# Patient Record
Sex: Female | Born: 1970 | Race: White | Hispanic: No | Marital: Married | Smoking: Never smoker
Health system: Southern US, Community
[De-identification: ages and names within clinical notes are randomized; demographics above are authoritative.]

## PROBLEM LIST (undated history)

## (undated) DIAGNOSIS — R011 Cardiac murmur, unspecified: Secondary | ICD-10-CM

## (undated) DIAGNOSIS — R51 Headache: Secondary | ICD-10-CM

## (undated) DIAGNOSIS — R519 Headache, unspecified: Secondary | ICD-10-CM

## (undated) DIAGNOSIS — B029 Zoster without complications: Secondary | ICD-10-CM

## (undated) DIAGNOSIS — E079 Disorder of thyroid, unspecified: Secondary | ICD-10-CM

## (undated) DIAGNOSIS — J45909 Unspecified asthma, uncomplicated: Secondary | ICD-10-CM

## (undated) HISTORY — DX: Cardiac murmur, unspecified: R01.1

## (undated) HISTORY — DX: Headache, unspecified: R51.9

## (undated) HISTORY — DX: Headache: R51

## (undated) HISTORY — DX: Unspecified asthma, uncomplicated: J45.909

## (undated) HISTORY — DX: Disorder of thyroid, unspecified: E07.9

## (undated) HISTORY — DX: Zoster without complications: B02.9

---

## 1992-09-10 HISTORY — PX: OVARIAN CYST REMOVAL: SHX89

## 1994-11-10 DIAGNOSIS — E079 Disorder of thyroid, unspecified: Secondary | ICD-10-CM

## 1994-11-10 HISTORY — DX: Disorder of thyroid, unspecified: E07.9

## 1998-04-18 ENCOUNTER — Ambulatory Visit (HOSPITAL_COMMUNITY): Admission: RE | Admit: 1998-04-18 | Discharge: 1998-04-18 | Payer: Self-pay | Admitting: Obstetrics and Gynecology

## 1998-05-07 ENCOUNTER — Inpatient Hospital Stay (HOSPITAL_COMMUNITY): Admission: AD | Admit: 1998-05-07 | Discharge: 1998-05-07 | Payer: Self-pay | Admitting: *Deleted

## 1998-06-15 ENCOUNTER — Encounter (HOSPITAL_COMMUNITY): Admission: RE | Admit: 1998-06-15 | Discharge: 1998-06-29 | Payer: Self-pay | Admitting: Obstetrics & Gynecology

## 1998-06-25 ENCOUNTER — Inpatient Hospital Stay (HOSPITAL_COMMUNITY): Admission: AD | Admit: 1998-06-25 | Discharge: 1998-06-25 | Payer: Self-pay | Admitting: Obstetrics and Gynecology

## 1998-06-27 ENCOUNTER — Inpatient Hospital Stay (HOSPITAL_COMMUNITY): Admission: AD | Admit: 1998-06-27 | Discharge: 1998-07-01 | Payer: Self-pay | Admitting: Obstetrics & Gynecology

## 1998-06-30 ENCOUNTER — Encounter (HOSPITAL_COMMUNITY): Admission: RE | Admit: 1998-06-30 | Discharge: 1998-09-28 | Payer: Self-pay | Admitting: Obstetrics & Gynecology

## 1998-08-06 ENCOUNTER — Other Ambulatory Visit: Admission: RE | Admit: 1998-08-06 | Discharge: 1998-08-06 | Payer: Self-pay | Admitting: Obstetrics and Gynecology

## 1998-10-08 ENCOUNTER — Encounter (HOSPITAL_COMMUNITY): Admission: RE | Admit: 1998-10-08 | Discharge: 1999-01-06 | Payer: Self-pay | Admitting: *Deleted

## 1999-01-31 ENCOUNTER — Encounter (HOSPITAL_COMMUNITY): Admission: RE | Admit: 1999-01-31 | Discharge: 1999-05-01 | Payer: Self-pay | Admitting: *Deleted

## 1999-05-15 ENCOUNTER — Emergency Department (HOSPITAL_COMMUNITY): Admission: EM | Admit: 1999-05-15 | Discharge: 1999-05-16 | Payer: Self-pay | Admitting: Emergency Medicine

## 2001-03-16 ENCOUNTER — Other Ambulatory Visit: Admission: RE | Admit: 2001-03-16 | Discharge: 2001-03-16 | Payer: Self-pay | Admitting: Obstetrics and Gynecology

## 2001-10-04 ENCOUNTER — Encounter: Admission: RE | Admit: 2001-10-04 | Discharge: 2001-11-24 | Payer: Self-pay | Admitting: Orthopedic Surgery

## 2002-02-18 ENCOUNTER — Encounter: Admission: RE | Admit: 2002-02-18 | Discharge: 2002-02-18 | Payer: Self-pay | Admitting: Obstetrics and Gynecology

## 2002-02-18 ENCOUNTER — Encounter: Payer: Self-pay | Admitting: Obstetrics and Gynecology

## 2002-11-15 ENCOUNTER — Emergency Department (HOSPITAL_COMMUNITY): Admission: EM | Admit: 2002-11-15 | Discharge: 2002-11-15 | Payer: Self-pay

## 2003-01-02 ENCOUNTER — Other Ambulatory Visit: Admission: RE | Admit: 2003-01-02 | Discharge: 2003-01-02 | Payer: Self-pay | Admitting: Obstetrics and Gynecology

## 2004-01-17 ENCOUNTER — Other Ambulatory Visit: Admission: RE | Admit: 2004-01-17 | Discharge: 2004-01-17 | Payer: Self-pay | Admitting: Obstetrics and Gynecology

## 2005-09-17 ENCOUNTER — Other Ambulatory Visit: Admission: RE | Admit: 2005-09-17 | Discharge: 2005-09-17 | Payer: Self-pay | Admitting: Obstetrics and Gynecology

## 2005-09-18 ENCOUNTER — Ambulatory Visit (HOSPITAL_COMMUNITY): Admission: RE | Admit: 2005-09-18 | Discharge: 2005-09-18 | Payer: Self-pay | Admitting: Allergy

## 2005-10-07 ENCOUNTER — Encounter: Admission: RE | Admit: 2005-10-07 | Discharge: 2005-10-07 | Payer: Self-pay | Admitting: Obstetrics and Gynecology

## 2006-02-02 ENCOUNTER — Emergency Department (HOSPITAL_COMMUNITY): Admission: EM | Admit: 2006-02-02 | Discharge: 2006-02-02 | Payer: Self-pay | Admitting: Emergency Medicine

## 2006-03-11 ENCOUNTER — Encounter: Payer: Self-pay | Admitting: Diagnostic Radiology

## 2010-01-25 ENCOUNTER — Encounter: Admission: RE | Admit: 2010-01-25 | Discharge: 2010-01-25 | Payer: Self-pay | Admitting: Obstetrics and Gynecology

## 2010-05-14 ENCOUNTER — Ambulatory Visit: Payer: Self-pay | Admitting: Pulmonary Disease

## 2010-10-17 ENCOUNTER — Inpatient Hospital Stay (HOSPITAL_COMMUNITY): Admission: EM | Admit: 2010-10-17 | Discharge: 2010-05-16 | Payer: Self-pay | Admitting: Emergency Medicine

## 2010-11-10 DIAGNOSIS — B029 Zoster without complications: Secondary | ICD-10-CM

## 2010-11-10 HISTORY — DX: Zoster without complications: B02.9

## 2011-01-26 LAB — BASIC METABOLIC PANEL WITH GFR
BUN: 4 mg/dL — ABNORMAL LOW (ref 6–23)
CO2: 28 meq/L (ref 19–32)
Calcium: 8.4 mg/dL (ref 8.4–10.5)
Chloride: 103 meq/L (ref 96–112)
Creatinine, Ser: 0.75 mg/dL (ref 0.4–1.2)
GFR calc non Af Amer: >60 mL/min
Glucose, Bld: 94 mg/dL (ref 70–99)
Potassium: 3.8 meq/L (ref 3.5–5.1)
Sodium: 136 meq/L (ref 135–145)

## 2011-01-26 LAB — BASIC METABOLIC PANEL
BUN: 6 mg/dL (ref 6–23)
CO2: 26 mEq/L (ref 19–32)
Chloride: 108 mEq/L (ref 96–112)
Creatinine, Ser: 0.67 mg/dL (ref 0.4–1.2)
GFR calc Af Amer: >60 mL/min (ref >60)
GFR calc non Af Amer: >60 mL/min (ref >60)
Glucose, Bld: 95 mg/dL (ref 70–99)
Potassium: 3.7 mEq/L (ref 3.5–5.1)
Sodium: 138 mEq/L (ref 135–145)

## 2011-01-26 LAB — CBC
HCT: 38.5 % (ref 36.0–46.0)
HCT: 45.3 % (ref 36.0–46.0)
Hemoglobin: 13.4 g/dL (ref 12.0–15.0)
Hemoglobin: 13.4 g/dL (ref 12.0–15.0)
Hemoglobin: 15.6 g/dL — ABNORMAL HIGH (ref 12.0–15.0)
MCH: 31.8 pg (ref 26.0–34.0)
MCH: 32.1 pg (ref 26.0–34.0)
MCH: 32.2 pg (ref 26.0–34.0)
MCH: 32.4 pg (ref 26.0–34.0)
MCHC: 34.4 g/dL (ref 30.0–36.0)
MCHC: 34.5 g/dL (ref 30.0–36.0)
MCHC: 34.6 g/dL (ref 30.0–36.0)
MCHC: 34.7 g/dL (ref 30.0–36.0)
MCV: 93.3 fL (ref 78.0–100.0)
MCV: 93.5 fL (ref 78.0–100.0)
Platelets: 223 10*3/uL (ref 150–400)
Platelets: 236 10*3/uL (ref 150–400)
Platelets: 332 K/uL (ref 150–400)
RBC: 3.87 MIL/uL (ref 3.87–5.11)
RBC: 4.12 MIL/uL (ref 3.87–5.11)
RBC: 4.86 MIL/uL (ref 3.87–5.11)
RDW: 12.8 % (ref 11.5–15.5)
RDW: 12.9 % (ref 11.5–15.5)
RDW: 13.2 % (ref 11.5–15.5)
WBC: 7.6 10*3/uL (ref 4.0–10.5)
WBC: 8.2 10*3/uL (ref 4.0–10.5)
WBC: 9.9 K/uL (ref 4.0–10.5)

## 2011-01-26 LAB — DIFFERENTIAL
Basophils Absolute: 0.2 10*3/uL — ABNORMAL HIGH (ref 0.0–0.1)
Basophils Relative: 2 % — ABNORMAL HIGH (ref 0–1)
Eosinophils Absolute: 0.3 10*3/uL (ref 0.0–0.7)
Eosinophils Absolute: 0.4 10*3/uL (ref 0.0–0.7)
Eosinophils Relative: 4 % (ref 0–5)
Lymphocytes Relative: 26 % (ref 12–46)
Lymphs Abs: 2.6 10*3/uL (ref 0.7–4.0)
Monocytes Absolute: 0.9 10*3/uL (ref 0.1–1.0)
Monocytes Relative: 8 % (ref 3–12)
Monocytes Relative: 9 % (ref 3–12)
Neutro Abs: 5.9 10*3/uL (ref 1.7–7.7)
Neutro Abs: 5.9 10*3/uL (ref 1.7–7.7)
Neutrophils Relative %: 59 % (ref 43–77)

## 2011-01-26 LAB — MAGNESIUM: Magnesium: 1.7 mg/dL (ref 1.5–2.5)

## 2011-01-26 LAB — COMPREHENSIVE METABOLIC PANEL WITH GFR
ALT: 16 U/L (ref 0–35)
AST: 24 U/L (ref 0–37)
Albumin: 4.5 g/dL (ref 3.5–5.2)
Alkaline Phosphatase: 32 U/L — ABNORMAL LOW (ref 39–117)
BUN: 6 mg/dL (ref 6–23)
CO2: 27 meq/L (ref 19–32)
Calcium: 9.3 mg/dL (ref 8.4–10.5)
Chloride: 105 meq/L (ref 96–112)
Creatinine, Ser: 0.96 mg/dL (ref 0.4–1.2)
GFR calc non Af Amer: >60 mL/min
Glucose, Bld: 77 mg/dL (ref 70–99)
Potassium: 3.7 meq/L (ref 3.5–5.1)
Sodium: 141 meq/L (ref 135–145)
Total Bilirubin: 0.5 mg/dL (ref 0.3–1.2)
Total Protein: 7.8 g/dL (ref 6.0–8.3)

## 2011-01-26 LAB — COMPREHENSIVE METABOLIC PANEL
CO2: 26 mEq/L (ref 19–32)
Calcium: 7.4 mg/dL — ABNORMAL LOW (ref 8.4–10.5)
Creatinine, Ser: 0.77 mg/dL (ref 0.4–1.2)
Potassium: 3.7 mEq/L (ref 3.5–5.1)
Sodium: 139 mEq/L (ref 135–145)
Total Bilirubin: 0.2 mg/dL — ABNORMAL LOW (ref 0.3–1.2)
Total Protein: 5.2 g/dL — ABNORMAL LOW (ref 6.0–8.3)

## 2011-01-26 LAB — CK TOTAL AND CKMB (NOT AT ARMC)
CK, MB: 0.5 ng/mL (ref 0.3–4.0)
CK, MB: 0.8 ng/mL (ref 0.3–4.0)
Relative Index: INVALID (ref 0.0–2.5)
Total CK: 63 U/L (ref 7–177)

## 2011-01-26 LAB — MRSA PCR SCREENING: MRSA by PCR: NEGATIVE

## 2011-01-26 LAB — PROTIME-INR
INR: 0.99 (ref 0.00–1.49)
Prothrombin Time: 13 s (ref 11.6–15.2)

## 2011-01-26 LAB — FIBRINOGEN: Fibrinogen: 228 mg/dL (ref 204–475)

## 2011-01-26 LAB — APTT: aPTT: 29 s (ref 24–37)

## 2011-02-10 ENCOUNTER — Other Ambulatory Visit: Payer: Self-pay | Admitting: Obstetrics and Gynecology

## 2012-11-26 ENCOUNTER — Other Ambulatory Visit: Payer: Self-pay | Admitting: Obstetrics and Gynecology

## 2012-11-26 DIAGNOSIS — R928 Other abnormal and inconclusive findings on diagnostic imaging of breast: Secondary | ICD-10-CM

## 2012-12-09 ENCOUNTER — Ambulatory Visit
Admission: RE | Admit: 2012-12-09 | Discharge: 2012-12-09 | Disposition: A | Discharge status: Home / Self Care | Payer: Self-pay | Source: Ambulatory Visit | Attending: Obstetrics and Gynecology | Admitting: Obstetrics and Gynecology

## 2012-12-09 ENCOUNTER — Other Ambulatory Visit: Payer: Self-pay

## 2012-12-09 DIAGNOSIS — R928 Other abnormal and inconclusive findings on diagnostic imaging of breast: Secondary | ICD-10-CM

## 2015-06-25 LAB — BASIC METABOLIC PANEL
BUN: 14 mg/dL (ref 4–21)
CREATININE: 0.8 mg/dL (ref 0.5–1.1)
GLUCOSE: 95 mg/dL
POTASSIUM: 4.9 mmol/L (ref 3.4–5.3)
Sodium: 138 mmol/L (ref 137–147)

## 2015-06-25 LAB — TSH: TSH: 0.02 u[IU]/mL — AB (ref 0.41–5.90)

## 2016-09-18 HISTORY — PX: DIAGNOSTIC MAMMOGRAM: HXRAD719

## 2016-09-23 ENCOUNTER — Ambulatory Visit: Payer: Self-pay | Admitting: Nurse Practitioner

## 2016-10-15 ENCOUNTER — Other Ambulatory Visit: Payer: Self-pay | Admitting: Obstetrics and Gynecology

## 2016-10-15 DIAGNOSIS — N644 Mastodynia: Secondary | ICD-10-CM

## 2016-10-21 ENCOUNTER — Ambulatory Visit: Payer: Self-pay | Admitting: Nurse Practitioner

## 2016-10-23 ENCOUNTER — Ambulatory Visit
Admission: RE | Admit: 2016-10-23 | Discharge: 2016-10-23 | Disposition: A | Discharge status: Home / Self Care | Payer: BLUE CROSS/BLUE SHIELD | Source: Ambulatory Visit | Attending: Obstetrics and Gynecology | Admitting: Obstetrics and Gynecology

## 2016-10-23 DIAGNOSIS — N644 Mastodynia: Secondary | ICD-10-CM

## 2016-10-24 ENCOUNTER — Ambulatory Visit (INDEPENDENT_AMBULATORY_CARE_PROVIDER_SITE_OTHER): Payer: BLUE CROSS/BLUE SHIELD | Admitting: Nurse Practitioner

## 2016-10-24 ENCOUNTER — Encounter: Payer: Self-pay | Admitting: Nurse Practitioner

## 2016-10-24 VITALS — BP 98/82 | HR 81 | Temp 98.7°F | Ht 65.0 in | Wt 148.0 lb

## 2016-10-24 DIAGNOSIS — E039 Hypothyroidism, unspecified: Secondary | ICD-10-CM | POA: Diagnosis not present

## 2016-10-24 DIAGNOSIS — Z0000 Encounter for general adult medical examination without abnormal findings: Secondary | ICD-10-CM | POA: Diagnosis not present

## 2016-10-24 DIAGNOSIS — B0089 Other herpesviral infection: Secondary | ICD-10-CM | POA: Diagnosis not present

## 2016-10-24 NOTE — Progress Notes (Signed)
Subjective:    Patient ID: Carrie Johnston, female    DOB: 1971/09/23, 45 y.o.   MRN: [id number]  Patient presents today for complete physical or establish care (new patient).  Rash  This is a recurrent problem. The current episode started more than 1 month ago. The problem has been waxing and waning since onset. The affected locations include the right buttock. The rash is characterized by pain, itchiness, redness, burning and blistering. She was exposed to nothing. Pertinent negatives include no anorexia, congestion, cough, diarrhea, facial edema, fever, joint pain, shortness of breath or sore throat. Treatments tried: valtrex. The treatment provided significant relief. Her past medical history is significant for varicella.  hx of more that 5 outbreaks in last 3months. Diagnosed with herpes zoster by urgent care provider.   Hypothyroidism: Thyroid disease managed by endocrinologist: Dr. Adrian PrinceStephen South. Last OV 2weeks ago.  Immunizations: (TDAP, Hep C screen, Pneumovax, Influenza, zoster)  Health Maintenance  Topic Date Due  . Flu Shot  02/07/2017*  . HIV Screening  10/12/2017*  . Pap Smear  10/15/2019  . Tetanus Vaccine  05/14/2020  *Topic was postponed. The date shown is not the original due date.   Diet:regular Weight:  Wt Readings from Last 3 Encounters:  10/24/16 148 lb (67.1 kg)   Exercise:walking and goes to gym Fall Risk: Fall Risk  10/24/2016  Falls in the past year? No   Home Safety:home with husband and children Depression/Suicide: [facility] 10/24/2016  Decreased Interest 0  Down, Depressed, Hopeless 0  PHQ - 2 Score 0   No flowsheet data found.  Pap Smear (every 7792yrs for >21-29 without HPV, every 35105yrs for >30-546105yrs with HPV):up to date, done by Dr. Lyda Jesterurtis with [facility], last done 10/14/2016 (normal per patient), records requested. Mammogram (yearly, >104105yrs):done 09/16/2016, normal per patient, records requested. Vision:needed,  patient will schedule Dental:up to date, cleaning every 6months  Sexual History (birth control, marital status, STD):sexually active, married  Medications and allergies reviewed with patient and updated if appropriate.  Patient Active Problem List   Diagnosis Date Noted  . Hypothyroidism 10/24/2016  . 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   No current facility-administered medications on file prior to visit.     Past Medical History:  Diagnosis Date  . Asthma   . Headache    frequent headache  . Heart murmur   . Shingles 2012   lower spin  . Thyroid disease 1996    Past Surgical History:  Procedure Laterality Date  . OVARIAN CYST REMOVAL  09/1992   cyst removal from ovary    Social History   Social History  . Marital status: Married    Spouse name: N/A  . Number of children: N/A  . Years of education: N/A   Social History Main Topics  . Smoking status: Never Smoker  . Smokeless tobacco: Never Used  . Alcohol use Yes     Comment: social  . Drug use: No  . Sexual activity: Not Asked   Other Topics Concern  . None   Social History Narrative  . None    Family History  Problem Relation Age of Onset  . Heart disease Maternal Grandmother   . Arthritis Maternal Grandfather   . Heart disease Maternal Grandfather   . Diabetes Maternal Grandfather   . Arthritis Paternal Grandmother     RA  . Heart disease Paternal Grandmother   . Diabetes Paternal  Grandmother   . Heart disease Paternal Grandfather         Review of Systems  Constitutional: Negative for fever, malaise/fatigue and weight loss.  HENT: Negative for congestion and sore throat.   Eyes:       Negative for visual changes  Respiratory: Negative for cough and shortness of breath.   Cardiovascular: Negative for chest pain, palpitations and leg swelling.  Gastrointestinal: Negative for anorexia, blood in stool, constipation, diarrhea and  heartburn.  Genitourinary: Negative for dysuria, frequency and urgency.  Musculoskeletal: Negative for falls, joint pain and myalgias.  Skin: Positive for rash.  Neurological: Negative for dizziness, sensory change and headaches.  Endo/Heme/Allergies: Does not bruise/bleed easily.  Psychiatric/Behavioral: Negative for depression, substance abuse and suicidal ideas. The patient is not nervous/anxious.     Objective:   Vitals:   10/24/16 1034  BP: 98/82  Pulse: 81  Temp: 98.7 F (37.1 C)    Body mass index is 24.63 kg/m.   Physical Examination:  Physical Exam  Constitutional: She is oriented to person, place, and time and well-developed, well-nourished, and in no distress. No distress.  HENT:  Right Ear: External ear normal.  Left Ear: External ear normal.  Nose: Nose normal.  Mouth/Throat: Oropharynx is clear and moist. No oropharyngeal exudate.  Eyes: Conjunctivae and EOM are normal. Pupils are equal, round, and reactive to light. No scleral icterus.  Neck: Normal range of motion. Neck supple. No thyromegaly present.  Cardiovascular: Normal rate, normal heart sounds and intact distal pulses.   Pulmonary/Chest: Effort normal and breath sounds normal. She exhibits no tenderness.  Breast exam deferred to GYN by patient.  Abdominal: Soft. Bowel sounds are normal. She exhibits no distension. There is no tenderness.  Genitourinary:  Genitourinary Comments: Deferred to GYN by patient  Musculoskeletal: Normal range of motion. She exhibits no edema or tenderness.  Lymphadenopathy:    She has no cervical adenopathy.  Neurological: She is alert and oriented to person, place, and time. Gait normal.  Skin: Skin is warm and dry. Rash noted. Rash is papular.     Psychiatric: Affect and judgment normal.    ASSESSMENT and PLAN:  Anabia was seen today for establish care.  Diagnoses and all orders for this visit:  Preventative health care -     CBC w/Diff; Future -      Comprehensive metabolic panel; Future -     Hemoglobin A1c; Future -     Lipid panel; Future  Hypothyroidism, unspecified type -     Lipid panel; Future  Herpes dermatitis   No problem-specific Assessment & Plan notes found for this encounter.     Follow up: Return in about 1 year (around 10/24/2017) for CPE.  Alysia Pennaharlotte Jerol Rufener, NP

## 2016-10-24 NOTE — Progress Notes (Signed)
Pre visit review using our clinic review tool, if applicable. No additional management support is needed unless otherwise documented below in the visit note. 

## 2016-10-24 NOTE — Patient Instructions (Addendum)
Sign medical release to get records from GYN (Dr. Julio Sicksarol Curtis with [facility]) and endocrinologist (dr. Adrian PrinceStephen South).  Verify with endocrinologist records if CBC, CMP, lipid panel, and HgbA1c was done within the last 6months.  Carrie Johnston was advised about use of valtrex suppression therapy due to recurrent vesicular rash on right buttocks. She was also advised to return to office for culture of rash if rash re occurs.  Shingles Shingles, which is also known as herpes zoster, is an infection that causes a painful skin rash and fluid-filled blisters. Shingles is not related to genital herpes, which is a sexually transmitted infection. Shingles only develops in people who:  Have had chickenpox.  Have received the chickenpox vaccine. (This is rare.) What are the causes? Shingles is caused by varicella-zoster virus (VZV). This is the same virus that causes chickenpox. After exposure to VZV, the virus stays in the body in an inactive (dormant) state. Shingles develops if the virus reactivates. This can happen many years after the initial exposure to VZV. It is not known what causes this virus to reactivate. What increases the risk? People who have had chickenpox or received the chickenpox vaccine are at risk for shingles. Infection is more common in people who:  Are older than age 46.  Have a weakened defense (immune) system, such as those with HIV, AIDS, or cancer.  Are taking medicines that weaken the immune system, such as transplant medicines.  Are under great stress. What are the signs or symptoms? Early symptoms of this condition include itching, tingling, and pain in an area on your skin. Pain may be described as burning, stabbing, or throbbing. A few days or weeks after symptoms start, a painful red rash appears, usually on one side of the body in a bandlike or beltlike pattern. The rash eventually turns into fluid-filled blisters that break open, scab over, and dry up in  about 2-3 weeks. At any time during the infection, you may also develop:  A fever.  Chills.  A headache.  An upset stomach. How is this diagnosed? This condition is diagnosed with a skin exam. Sometimes, skin or fluid samples are taken from the blisters before a diagnosis is made. These samples are examined under a microscope or sent to a lab for testing. How is this treated? There is no specific cure for this condition. Your health care provider will probably prescribe medicines to help you manage pain, recover more quickly, and avoid long-term problems. Medicines may include:  Antiviral drugs.  Anti-inflammatory drugs.  Pain medicines. If the area involved is on your face, you may be referred to a specialist, such as an eye doctor (ophthalmologist) or an ear, nose, and throat (ENT) doctor to help you avoid eye problems, chronic pain, or disability. Follow these instructions at home: Medicines  Take medicines only as directed by your health care provider.  Apply an anti-itch or numbing cream to the affected area as directed by your health care provider. Blister and Rash Care  Take a cool bath or apply cool compresses to the area of the rash or blisters as directed by your health care provider. This may help with pain and itching.  Keep your rash covered with a loose bandage (dressing). Wear loose-fitting clothing to help ease the pain of material rubbing against the rash.  Keep your rash and blisters clean with mild soap and cool water or as directed by your health care provider.  Check your rash every day for signs of  infection. These include redness, swelling, and pain that lasts or increases.  Do not pick your blisters.  Do not scratch your rash. General instructions  Rest as directed by your health care provider.  Keep all follow-up visits as directed by your health care provider. This is important.  Until your blisters scab over, your infection can cause chickenpox  in people who have never had it or been vaccinated against it. To prevent this from happening, avoid contact with other people, especially:  Babies.  Pregnant women.  Children who have eczema.  Elderly people who have transplants.  People who have chronic illnesses, such as leukemia or AIDS. Contact a health care provider if:  Your pain is not relieved with prescribed medicines.  Your pain does not get better after the rash heals.  Your rash looks infected. Signs of infection include redness, swelling, and pain that lasts or increases. Get help right away if:  The rash is on your face or nose.  You have facial pain, pain around your eye area, or loss of feeling on one side of your face.  You have ear pain or you have ringing in your ear.  You have loss of taste.  Your condition gets worse. This information is not intended to replace advice given to you by your health care provider. Make sure you discuss any questions you have with your health care provider. Document Released: 10/27/2005 Document Revised: 06/22/2016 Document Reviewed: 09/07/2014 Elsevier Interactive Patient Education  2017 [employer]

## 2016-11-10 ENCOUNTER — Encounter: Payer: Self-pay | Admitting: Nurse Practitioner

## 2016-11-13 ENCOUNTER — Telehealth: Payer: Self-pay | Admitting: Nurse Practitioner

## 2016-11-13 NOTE — Telephone Encounter (Signed)
[facility] forward 4 pages to Lester KinsmanNche Charotte NP

## 2016-11-24 ENCOUNTER — Encounter: Payer: Self-pay | Admitting: Nurse Practitioner

## 2016-11-24 NOTE — Progress Notes (Signed)
Abstracted result and sent to scan  

## 2017-08-26 ENCOUNTER — Encounter: Payer: BLUE CROSS/BLUE SHIELD | Admitting: Nurse Practitioner

## 2017-09-01 ENCOUNTER — Encounter: Payer: Self-pay | Admitting: Nurse Practitioner

## 2018-03-22 ENCOUNTER — Other Ambulatory Visit: Payer: Self-pay | Admitting: Orthopedic Surgery

## 2018-03-22 DIAGNOSIS — M5416 Radiculopathy, lumbar region: Secondary | ICD-10-CM

## 2018-03-23 ENCOUNTER — Ambulatory Visit
Admission: RE | Admit: 2018-03-23 | Discharge: 2018-03-23 | Disposition: A | Discharge status: Home / Self Care | Payer: No Typology Code available for payment source | Source: Ambulatory Visit | Attending: Orthopedic Surgery | Admitting: Orthopedic Surgery

## 2018-03-23 ENCOUNTER — Encounter: Payer: Self-pay | Admitting: Radiology

## 2018-03-23 DIAGNOSIS — M5416 Radiculopathy, lumbar region: Secondary | ICD-10-CM

## 2018-03-26 ENCOUNTER — Other Ambulatory Visit: Payer: Self-pay | Admitting: Orthopedic Surgery

## 2018-03-26 DIAGNOSIS — M48061 Spinal stenosis, lumbar region without neurogenic claudication: Secondary | ICD-10-CM

## 2022-03-14 ENCOUNTER — Other Ambulatory Visit: Payer: Self-pay | Admitting: Neurological Surgery

## 2022-03-14 DIAGNOSIS — M4316 Spondylolisthesis, lumbar region: Secondary | ICD-10-CM

## 2022-03-19 ENCOUNTER — Other Ambulatory Visit: Payer: Self-pay

## 2022-03-20 ENCOUNTER — Ambulatory Visit
Admission: RE | Admit: 2022-03-20 | Discharge: 2022-03-20 | Disposition: A | Discharge status: Home / Self Care | Payer: Self-pay | Source: Ambulatory Visit | Attending: Neurological Surgery | Admitting: Neurological Surgery

## 2022-03-20 DIAGNOSIS — M4316 Spondylolisthesis, lumbar region: Secondary | ICD-10-CM

## 2023-07-09 IMAGING — CT CT L SPINE W/O CM
1 of 5 series · 8 of 14 positions shown, 10 images · non-contrast
Comparison: No prior CT of the lumbar spine, correlation is made
with MRI 10/29/2021

CLINICAL DATA: Spondylolisthesis



[Series 2: l-spine 2.0 st · axial · 0.30mm/px · z∈[-229,-25]mm · 8 of 132 slices shown, 10 images]
[im 15/132  soft-tissue]
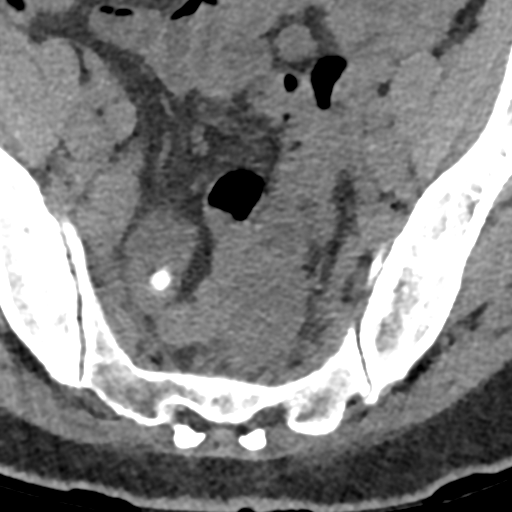
[im 15/132  bone]
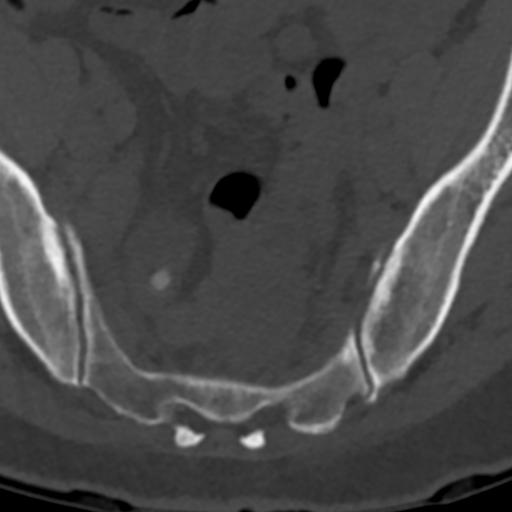
[im 30/132  bone]
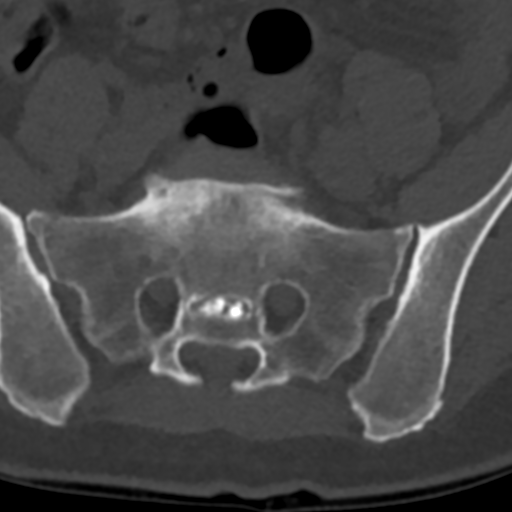
[im 44/132  bone]
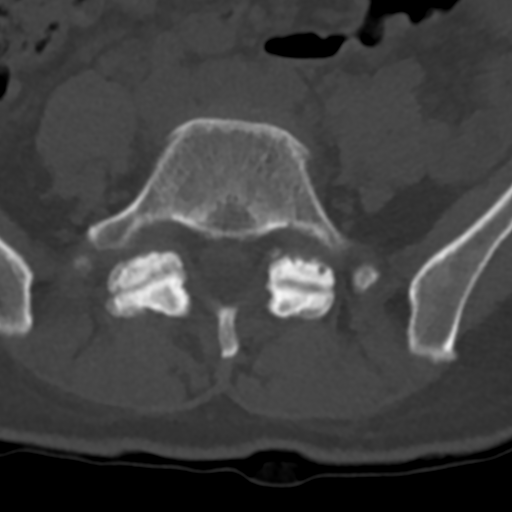
[im 59/132  bone]
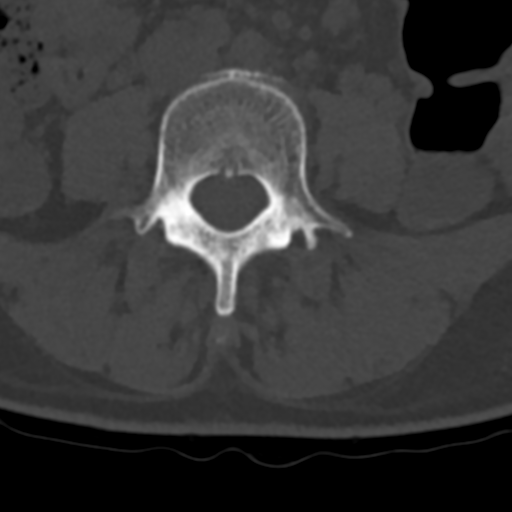
[im 73/132  soft-tissue]
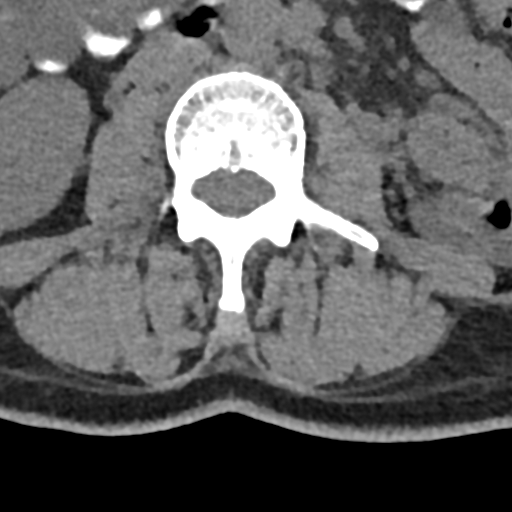
[im 73/132  bone]
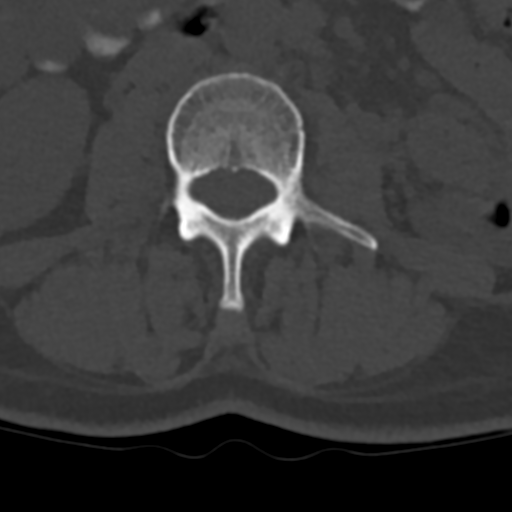
[im 88/132  bone]
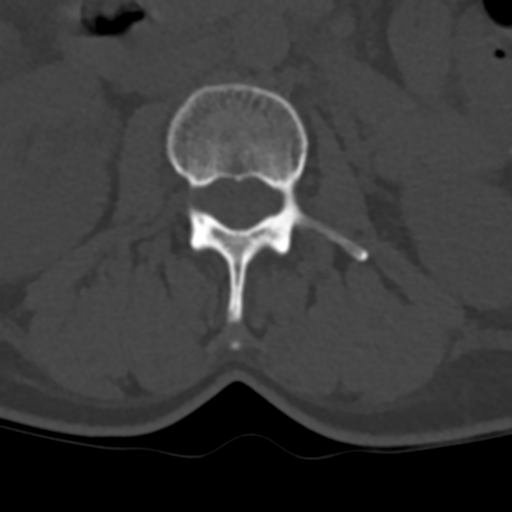
[im 102/132  bone]
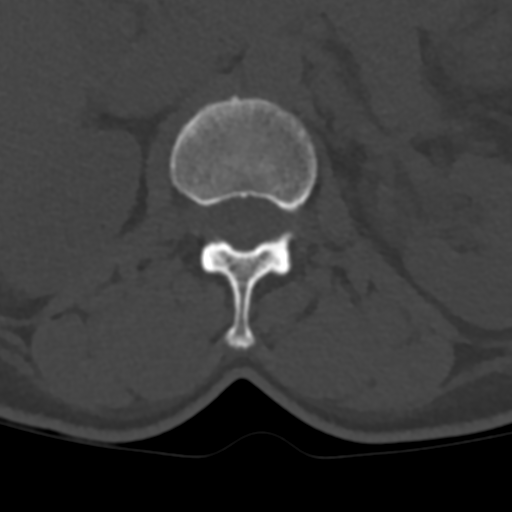
[im 117/132  bone]
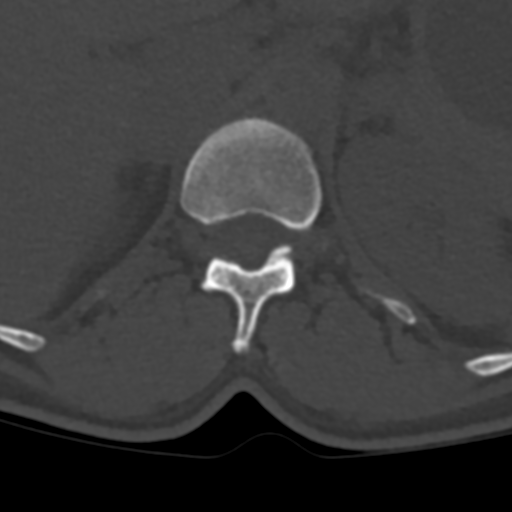

[8 of 14 positions shown; findings below may reference images not displayed]

FINDINGS: Segmentation: 5 lumbar type vertebrae.

Alignment: Grade 1 anterolisthesis L4 on L5, unchanged. Mild
dextrocurvature of the upper to mid lumbar spine and compensatory
levocurvature of the lower lumbar spine.

Vertebrae: No acute fracture or suspicious osseous lesion. Endplate
degenerative changes at L5-S1

Paraspinal and other soft tissues: Negative.

Disc levels:

T12-L1: No significant disc bulge. No spinal canal stenosis or
neural foraminal narrowing.

L1-L2: Minimal disc bulge. No spinal canal stenosis or neural
foraminal narrowing.

L2-L3: Minimal disc bulge. No spinal canal stenosis or neural
foraminal narrowing.

L3-L4: No significant disc bulge. Mild facet arthropathy. No spinal
canal stenosis or neural foraminal narrowing.

L4-L5: Grade 1 anterolisthesis with disc unroofing and moderate disc
bulge, which may contact the exiting L4 nerves. Severe left and
moderate right facet arthropathy. Ligamentum flavum hypertrophy.
Moderate spinal canal stenosis and mild-to-moderate left neural
foraminal narrowing, which appears similar to the prior MRI when
accounting for differences in technique. Narrowing of the lateral
recesses.

L5-S1: Disc desiccation with mild disc bulge. Moderate facet
arthropathy. No spinal canal stenosis. Mild-to-moderate right neural
foraminal narrowing, which appears similar to the prior MRI.
IMPRESSION: 1. L4-L5 moderate spinal canal stenosis and mild-to-moderate left
neural foraminal narrowing. A broad-based disc bulge may contact the
exiting L4 nerves, and narrowing of the lateral recesses at this
level could affect descending L5 nerve roots.
2. L5-S1 mild-to-moderate right neural foraminal narrowing.
